# Patient Record
Sex: Female | Born: 1945
Health system: Southern US, Community
[De-identification: ages and names within clinical notes are randomized; demographics above are authoritative.]

## PROBLEM LIST (undated history)

## (undated) DIAGNOSIS — E7800 Pure hypercholesterolemia, unspecified: Secondary | ICD-10-CM

## (undated) DIAGNOSIS — I10 Essential (primary) hypertension: Secondary | ICD-10-CM

---

## 2004-09-20 ENCOUNTER — Ambulatory Visit: Payer: Self-pay | Admitting: Unknown Physician Specialty

## 2005-09-26 ENCOUNTER — Ambulatory Visit: Payer: Self-pay | Admitting: Unknown Physician Specialty

## 2005-12-13 ENCOUNTER — Ambulatory Visit: Payer: Self-pay | Admitting: Orthopaedic Surgery

## 2006-10-10 ENCOUNTER — Ambulatory Visit: Payer: Self-pay | Admitting: Unknown Physician Specialty

## 2007-10-12 ENCOUNTER — Ambulatory Visit: Payer: Self-pay | Admitting: Unknown Physician Specialty

## 2007-10-17 ENCOUNTER — Ambulatory Visit: Payer: Self-pay | Admitting: Unknown Physician Specialty

## 2008-11-06 ENCOUNTER — Ambulatory Visit: Payer: Self-pay | Admitting: Unknown Physician Specialty

## 2009-06-26 ENCOUNTER — Emergency Department: Payer: Self-pay | Admitting: Emergency Medicine

## 2009-11-10 ENCOUNTER — Ambulatory Visit: Payer: Self-pay | Admitting: Unknown Physician Specialty

## 2010-11-08 IMAGING — CR DG CHEST 2V
1 series · 2 of 2 positions shown · non-contrast
Comparison: none

REASON FOR EXAM: CP
COMMENTS:

[Series 1: view not recorded · 0.17mm/px · 2 of 2 slices shown]
[im 1/2]
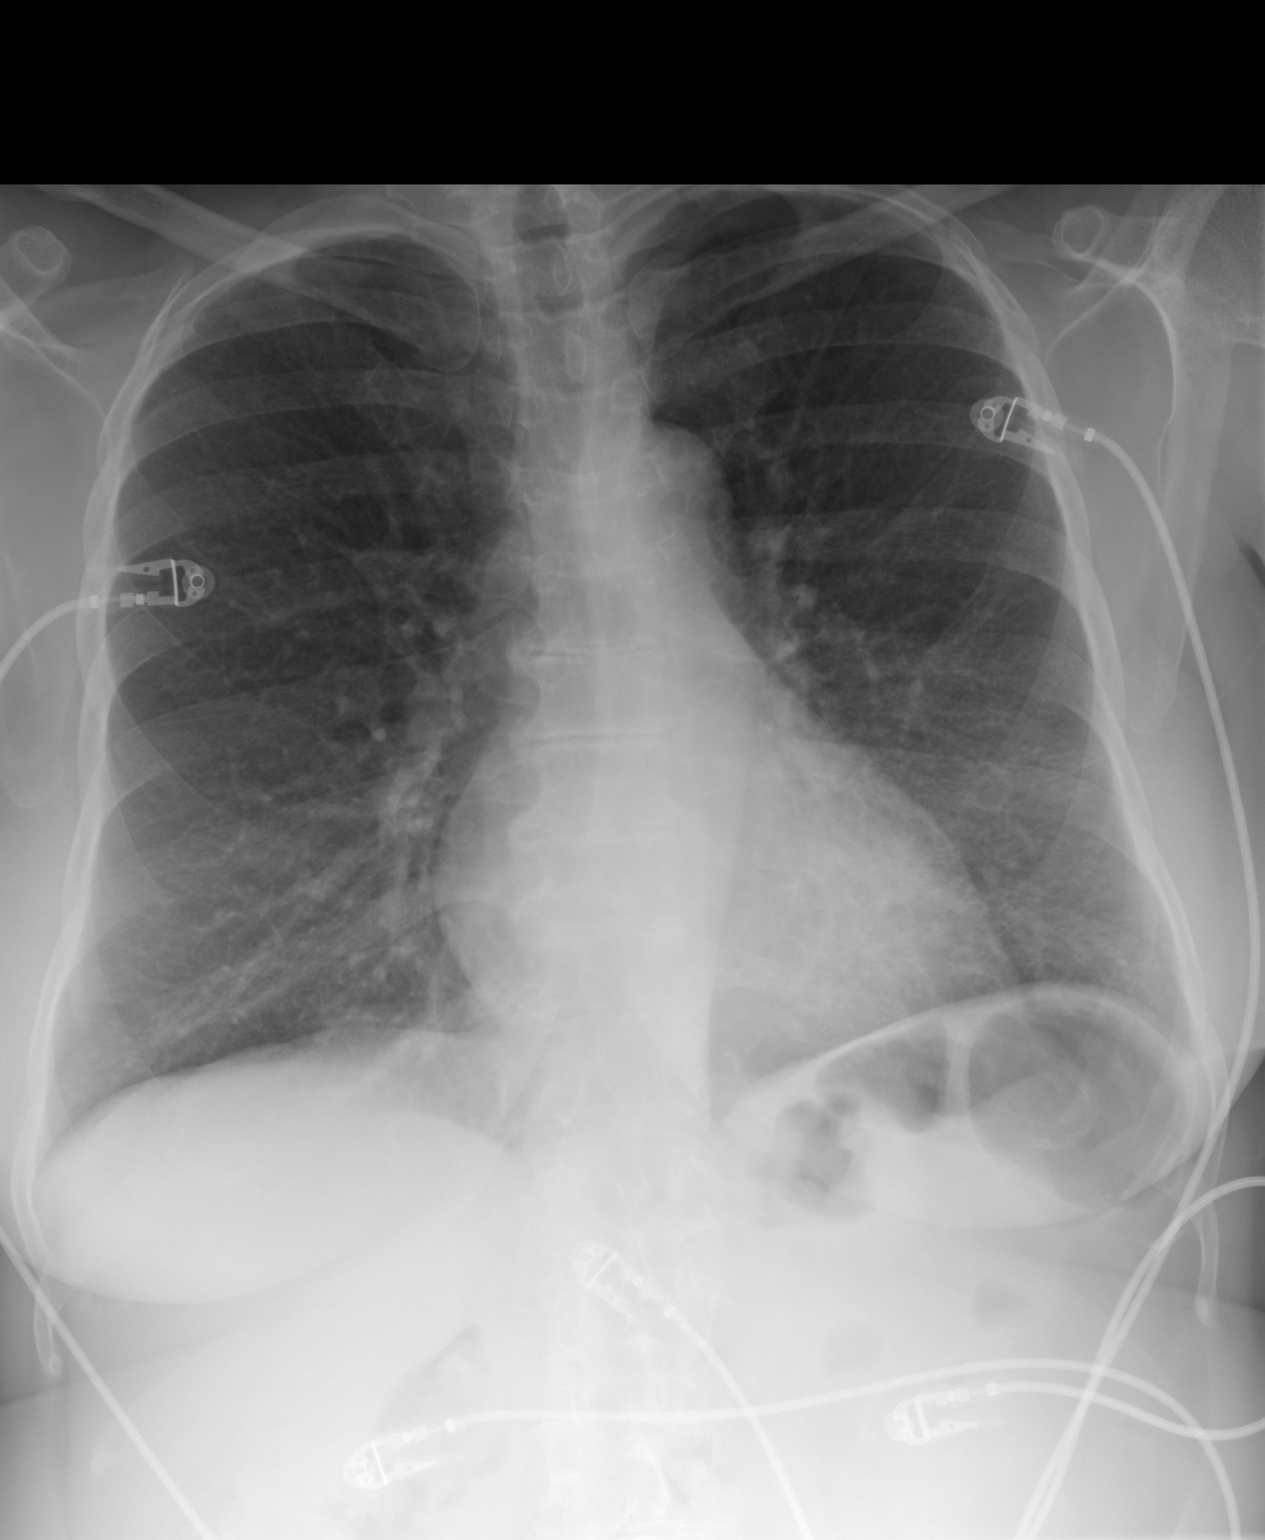
[im 2/2]
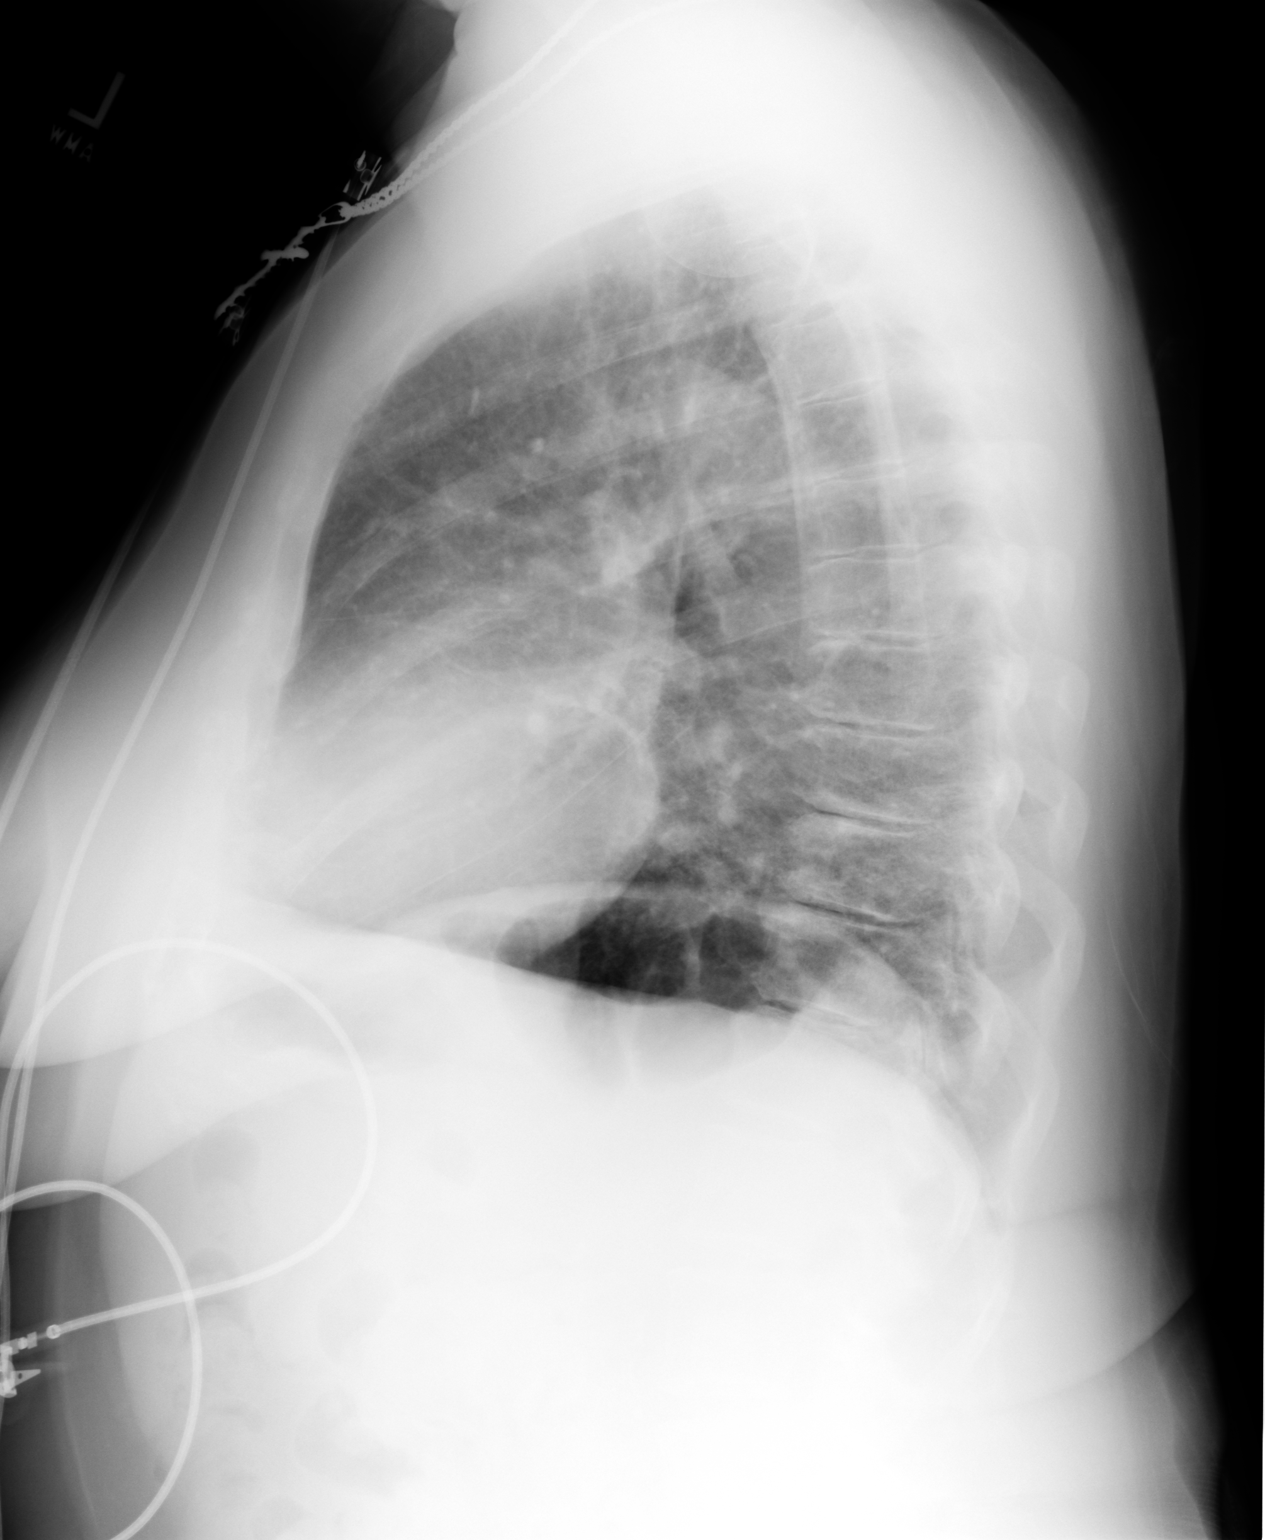

[2 of 2 positions shown; findings below may reference images not displayed]

PROCEDURE:     DXR - DXR CHEST PA (OR AP) AND LATERAL  - June 26, 2009  [DATE]

RESULT:     The lungs are well-expanded. The interstitial markings are
mildly increased diffusely. There is no pleural effusion or alveolar
infiltrate. The cardiac silhouette is normal in size. The there is mild
tortuosity of the descending thoracic aorta. There is degenerative disease
at multiple thoracic disc levels.
IMPRESSION: There is no focal pneumonia nor overt evidence of CHF.
There are mildly increased interstitial markings which may be related to the
history of tobacco use. There are degenerative changes of the thoracic spine.

## 2010-11-17 ENCOUNTER — Ambulatory Visit: Payer: Self-pay | Admitting: Unknown Physician Specialty

## 2011-12-21 ENCOUNTER — Ambulatory Visit: Payer: Self-pay | Admitting: Internal Medicine

## 2012-12-24 ENCOUNTER — Ambulatory Visit: Payer: Self-pay | Admitting: Internal Medicine

## 2013-12-24 ENCOUNTER — Ambulatory Visit: Payer: Self-pay | Admitting: Obstetrics and Gynecology

## 2013-12-24 DIAGNOSIS — Z1231 Encounter for screening mammogram for malignant neoplasm of breast: Secondary | ICD-10-CM | POA: Diagnosis not present

## 2013-12-24 DIAGNOSIS — Z13820 Encounter for screening for osteoporosis: Secondary | ICD-10-CM | POA: Diagnosis not present

## 2013-12-24 DIAGNOSIS — M949 Disorder of cartilage, unspecified: Secondary | ICD-10-CM | POA: Diagnosis not present

## 2013-12-24 DIAGNOSIS — M899 Disorder of bone, unspecified: Secondary | ICD-10-CM | POA: Diagnosis not present

## 2014-07-09 DIAGNOSIS — H2513 Age-related nuclear cataract, bilateral: Secondary | ICD-10-CM | POA: Diagnosis not present

## 2014-10-27 DIAGNOSIS — H43812 Vitreous degeneration, left eye: Secondary | ICD-10-CM | POA: Diagnosis not present

## 2014-10-29 DIAGNOSIS — H43812 Vitreous degeneration, left eye: Secondary | ICD-10-CM | POA: Diagnosis not present

## 2014-11-17 DIAGNOSIS — H43812 Vitreous degeneration, left eye: Secondary | ICD-10-CM | POA: Diagnosis not present

## 2014-12-08 DIAGNOSIS — I10 Essential (primary) hypertension: Secondary | ICD-10-CM | POA: Diagnosis not present

## 2014-12-08 DIAGNOSIS — Z124 Encounter for screening for malignant neoplasm of cervix: Secondary | ICD-10-CM | POA: Diagnosis not present

## 2014-12-08 DIAGNOSIS — Z01419 Encounter for gynecological examination (general) (routine) without abnormal findings: Secondary | ICD-10-CM | POA: Diagnosis not present

## 2014-12-10 DIAGNOSIS — I10 Essential (primary) hypertension: Secondary | ICD-10-CM | POA: Diagnosis not present

## 2014-12-26 ENCOUNTER — Ambulatory Visit
Admit: 2014-12-26 | Disposition: A | Discharge status: Home / Self Care | Payer: Self-pay | Attending: Obstetrics and Gynecology | Admitting: Obstetrics and Gynecology

## 2014-12-26 DIAGNOSIS — Z1231 Encounter for screening mammogram for malignant neoplasm of breast: Secondary | ICD-10-CM | POA: Diagnosis not present

## 2014-12-31 DIAGNOSIS — I10 Essential (primary) hypertension: Secondary | ICD-10-CM | POA: Diagnosis not present

## 2015-01-21 DIAGNOSIS — H43812 Vitreous degeneration, left eye: Secondary | ICD-10-CM | POA: Diagnosis not present

## 2015-02-03 DIAGNOSIS — R809 Proteinuria, unspecified: Secondary | ICD-10-CM | POA: Diagnosis not present

## 2015-05-31 DIAGNOSIS — Z23 Encounter for immunization: Secondary | ICD-10-CM | POA: Diagnosis not present

## 2015-07-28 DIAGNOSIS — H25813 Combined forms of age-related cataract, bilateral: Secondary | ICD-10-CM | POA: Diagnosis not present

## 2015-08-06 DIAGNOSIS — I10 Essential (primary) hypertension: Secondary | ICD-10-CM | POA: Diagnosis not present

## 2015-11-24 DIAGNOSIS — Z13220 Encounter for screening for lipoid disorders: Secondary | ICD-10-CM | POA: Diagnosis not present

## 2015-11-24 DIAGNOSIS — Z131 Encounter for screening for diabetes mellitus: Secondary | ICD-10-CM | POA: Diagnosis not present

## 2015-11-24 DIAGNOSIS — Z136 Encounter for screening for cardiovascular disorders: Secondary | ICD-10-CM | POA: Diagnosis not present

## 2015-11-24 DIAGNOSIS — Z1321 Encounter for screening for nutritional disorder: Secondary | ICD-10-CM | POA: Diagnosis not present

## 2015-11-24 DIAGNOSIS — Z1329 Encounter for screening for other suspected endocrine disorder: Secondary | ICD-10-CM | POA: Diagnosis not present

## 2015-12-09 ENCOUNTER — Other Ambulatory Visit: Payer: Self-pay | Admitting: Obstetrics and Gynecology

## 2015-12-09 DIAGNOSIS — I10 Essential (primary) hypertension: Secondary | ICD-10-CM | POA: Diagnosis not present

## 2015-12-09 DIAGNOSIS — Z1231 Encounter for screening mammogram for malignant neoplasm of breast: Secondary | ICD-10-CM

## 2015-12-28 ENCOUNTER — Other Ambulatory Visit: Payer: Self-pay | Admitting: Obstetrics and Gynecology

## 2015-12-28 ENCOUNTER — Ambulatory Visit
Admission: RE | Admit: 2015-12-28 | Discharge: 2015-12-28 | Disposition: A | Discharge status: Home / Self Care | Payer: Medicare Other | Source: Ambulatory Visit | Attending: Obstetrics and Gynecology | Admitting: Obstetrics and Gynecology

## 2015-12-28 DIAGNOSIS — Z1231 Encounter for screening mammogram for malignant neoplasm of breast: Secondary | ICD-10-CM | POA: Diagnosis not present

## 2016-01-29 DIAGNOSIS — I10 Essential (primary) hypertension: Secondary | ICD-10-CM | POA: Diagnosis not present

## 2016-01-29 DIAGNOSIS — R809 Proteinuria, unspecified: Secondary | ICD-10-CM | POA: Diagnosis not present

## 2016-02-03 DIAGNOSIS — R809 Proteinuria, unspecified: Secondary | ICD-10-CM | POA: Diagnosis not present

## 2016-07-21 DIAGNOSIS — H25813 Combined forms of age-related cataract, bilateral: Secondary | ICD-10-CM | POA: Diagnosis not present

## 2017-01-25 DIAGNOSIS — I10 Essential (primary) hypertension: Secondary | ICD-10-CM | POA: Diagnosis not present

## 2017-01-25 DIAGNOSIS — R809 Proteinuria, unspecified: Secondary | ICD-10-CM | POA: Diagnosis not present

## 2017-02-08 ENCOUNTER — Other Ambulatory Visit: Payer: Self-pay | Admitting: Obstetrics and Gynecology

## 2017-02-08 DIAGNOSIS — Z1231 Encounter for screening mammogram for malignant neoplasm of breast: Secondary | ICD-10-CM

## 2017-02-28 ENCOUNTER — Ambulatory Visit
Admission: RE | Admit: 2017-02-28 | Discharge: 2017-02-28 | Disposition: A | Discharge status: Home / Self Care | Payer: Medicare Other | Source: Ambulatory Visit | Attending: Obstetrics and Gynecology | Admitting: Obstetrics and Gynecology

## 2017-02-28 DIAGNOSIS — Z1231 Encounter for screening mammogram for malignant neoplasm of breast: Secondary | ICD-10-CM | POA: Insufficient documentation

## 2017-07-17 DIAGNOSIS — H25813 Combined forms of age-related cataract, bilateral: Secondary | ICD-10-CM | POA: Diagnosis not present

## 2017-08-07 DIAGNOSIS — H43813 Vitreous degeneration, bilateral: Secondary | ICD-10-CM | POA: Diagnosis not present

## 2017-10-04 DIAGNOSIS — H43811 Vitreous degeneration, right eye: Secondary | ICD-10-CM | POA: Diagnosis not present

## 2017-10-10 DIAGNOSIS — I10 Essential (primary) hypertension: Secondary | ICD-10-CM | POA: Diagnosis not present

## 2017-10-10 DIAGNOSIS — Z136 Encounter for screening for cardiovascular disorders: Secondary | ICD-10-CM | POA: Diagnosis not present

## 2017-10-10 DIAGNOSIS — Z13220 Encounter for screening for lipoid disorders: Secondary | ICD-10-CM | POA: Diagnosis not present

## 2017-10-10 DIAGNOSIS — Z131 Encounter for screening for diabetes mellitus: Secondary | ICD-10-CM | POA: Diagnosis not present

## 2017-10-10 DIAGNOSIS — Z1321 Encounter for screening for nutritional disorder: Secondary | ICD-10-CM | POA: Diagnosis not present

## 2017-10-11 ENCOUNTER — Other Ambulatory Visit: Payer: Self-pay | Admitting: Obstetrics and Gynecology

## 2017-10-11 DIAGNOSIS — Z1231 Encounter for screening mammogram for malignant neoplasm of breast: Secondary | ICD-10-CM

## 2017-11-21 DIAGNOSIS — R944 Abnormal results of kidney function studies: Secondary | ICD-10-CM | POA: Diagnosis not present

## 2017-11-21 DIAGNOSIS — R7309 Other abnormal glucose: Secondary | ICD-10-CM | POA: Diagnosis not present

## 2017-11-21 DIAGNOSIS — R748 Abnormal levels of other serum enzymes: Secondary | ICD-10-CM | POA: Diagnosis not present

## 2018-01-03 DIAGNOSIS — H43811 Vitreous degeneration, right eye: Secondary | ICD-10-CM | POA: Diagnosis not present

## 2018-01-11 DIAGNOSIS — M170 Bilateral primary osteoarthritis of knee: Secondary | ICD-10-CM | POA: Diagnosis not present

## 2018-02-22 DIAGNOSIS — R809 Proteinuria, unspecified: Secondary | ICD-10-CM | POA: Diagnosis not present

## 2018-02-22 DIAGNOSIS — I10 Essential (primary) hypertension: Secondary | ICD-10-CM | POA: Diagnosis not present

## 2018-02-22 DIAGNOSIS — E875 Hyperkalemia: Secondary | ICD-10-CM | POA: Diagnosis not present

## 2018-02-22 DIAGNOSIS — E785 Hyperlipidemia, unspecified: Secondary | ICD-10-CM | POA: Diagnosis not present

## 2018-03-02 ENCOUNTER — Ambulatory Visit
Admission: RE | Admit: 2018-03-02 | Discharge: 2018-03-02 | Disposition: A | Discharge status: Home / Self Care | Payer: Medicare Other | Source: Ambulatory Visit | Attending: Obstetrics and Gynecology | Admitting: Obstetrics and Gynecology

## 2018-03-02 DIAGNOSIS — Z1231 Encounter for screening mammogram for malignant neoplasm of breast: Secondary | ICD-10-CM

## 2018-03-15 DIAGNOSIS — E782 Mixed hyperlipidemia: Secondary | ICD-10-CM | POA: Diagnosis not present

## 2018-03-15 DIAGNOSIS — E559 Vitamin D deficiency, unspecified: Secondary | ICD-10-CM | POA: Diagnosis not present

## 2018-03-15 DIAGNOSIS — I10 Essential (primary) hypertension: Secondary | ICD-10-CM | POA: Diagnosis not present

## 2018-05-04 DIAGNOSIS — H43811 Vitreous degeneration, right eye: Secondary | ICD-10-CM | POA: Diagnosis not present

## 2018-05-04 DIAGNOSIS — H16223 Keratoconjunctivitis sicca, not specified as Sjogren's, bilateral: Secondary | ICD-10-CM | POA: Diagnosis not present

## 2018-07-09 ENCOUNTER — Other Ambulatory Visit: Payer: Self-pay

## 2018-07-09 ENCOUNTER — Emergency Department
Admission: EM | Admit: 2018-07-09 | Discharge: 2018-07-09 | Disposition: A | Discharge status: Home / Self Care | Payer: Medicare Other | Attending: Emergency Medicine | Admitting: Emergency Medicine

## 2018-07-09 DIAGNOSIS — I10 Essential (primary) hypertension: Secondary | ICD-10-CM | POA: Diagnosis not present

## 2018-07-09 DIAGNOSIS — E7800 Pure hypercholesterolemia, unspecified: Secondary | ICD-10-CM | POA: Diagnosis not present

## 2018-07-09 DIAGNOSIS — E860 Dehydration: Secondary | ICD-10-CM | POA: Diagnosis not present

## 2018-07-09 DIAGNOSIS — R42 Dizziness and giddiness: Secondary | ICD-10-CM | POA: Diagnosis present

## 2018-07-09 DIAGNOSIS — R531 Weakness: Secondary | ICD-10-CM | POA: Diagnosis not present

## 2018-07-09 HISTORY — DX: Essential (primary) hypertension: I10

## 2018-07-09 HISTORY — DX: Pure hypercholesterolemia, unspecified: E78.00

## 2018-07-09 LAB — URINALYSIS, COMPLETE (UACMP) WITH MICROSCOPIC
Bacteria, UA: NONE SEEN
Bilirubin Urine: NEGATIVE
GLUCOSE, UA: NEGATIVE mg/dL
Hgb urine dipstick: NEGATIVE
Ketones, ur: 5 mg/dL — AB
Leukocytes, UA: NEGATIVE
Nitrite: NEGATIVE
PH: 6 (ref 5.0–8.0)
Protein, ur: NEGATIVE mg/dL
Specific Gravity, Urine: 1.005 (ref 1.005–1.030)

## 2018-07-09 LAB — BASIC METABOLIC PANEL
Anion gap: 10 (ref 5–15)
BUN: 15 mg/dL (ref 8–23)
CO2: 26 mmol/L (ref 22–32)
Calcium: 9.7 mg/dL (ref 8.9–10.3)
Chloride: 101 mmol/L (ref 98–111)
Creatinine, Ser: 0.87 mg/dL (ref 0.44–1.00)
GFR calc Af Amer: >60 mL/min (ref >60)
Glucose, Bld: 116 mg/dL — ABNORMAL HIGH (ref 70–99)
POTASSIUM: 3.5 mmol/L (ref 3.5–5.1)
SODIUM: 137 mmol/L (ref 135–145)

## 2018-07-09 LAB — CBC
HCT: 39.3 % (ref 36.0–46.0)
Hemoglobin: 13 g/dL (ref 12.0–15.0)
MCH: 30.5 pg (ref 26.0–34.0)
MCHC: 33.1 g/dL (ref 30.0–36.0)
MCV: 92.3 fL (ref 80.0–100.0)
Platelets: 235 10*3/uL (ref 150–400)
RBC: 4.26 MIL/uL (ref 3.87–5.11)
RDW: 15.1 % (ref 11.5–15.5)
WBC: 6.6 10*3/uL (ref 4.0–10.5)
nRBC: 0 % (ref 0.0–0.2)

## 2018-07-09 MED ORDER — SODIUM CHLORIDE 0.9 % IV SOLN
1000.0000 mL | Freq: Once | INTRAVENOUS | Status: AC
Start: 2018-07-09 — End: 2018-07-09
  Administered 2018-07-09: 1000 mL via INTRAVENOUS

## 2018-07-09 NOTE — ED Notes (Signed)

## 2018-07-09 NOTE — ED Triage Notes (Signed)
C/o dizziness since Sunday and low blood pressure today. States dizziness started Sunday. Has BP readings low today multiple times while at work.  Pt alert and oriented X4, active, cooperative, pt in NAD. RR even and unlabored, color WNL.

## 2018-07-09 NOTE — ED Provider Notes (Signed)
[facility] Emergency Department Provider Note   ____________________________________________    I have reviewed the triage vital signs and the nursing notes.   HISTORY  Chief Complaint Dizziness and Hypotension     HPI Kimberly Buckley is a 72 y.o. female who presents with complaints of lightheadedness.  Patient reports that she has felt lightheaded intermittently throughout the day.  She reports that she checked her blood pressure multiple times and each time she did so it seemed to make her feel even worse.  She denies chest pain or palpitations.  No abdominal pain nausea or vomiting.  No fevers or chills or shortness of breath.  Currently she reports she feels well but she is quite anxious because "I never get sick "  Past Medical History:  Diagnosis Date  . Hypercholesteremia   . Hypertension     There are no active problems to display for this patient.   History reviewed. No pertinent surgical history.  Prior to Admission medications   Not on File     Allergies Bee venom  Family History  Problem Relation Age of Onset  . Breast cancer Sister     Social History Social History   Tobacco Use  . Smoking status: Never Smoker  Substance Use Topics  . Alcohol use: Not Currently  . Drug use: Not on file    Review of Systems  Constitutional: No fever/chills Eyes: No visual changes.  ENT: No sore throat. Cardiovascular: Denies chest pain. Respiratory: Denies shortness of breath. Gastrointestinal: No abdominal pain.     Genitourinary: Negative for dysuria. Musculoskeletal: Negative for back pain. Skin: Negative for rash. Neurological: Negative for headaches    ____________________________________________   PHYSICAL EXAM:  VITAL SIGNS: ED Triage Vitals  Enc Vitals Group     BP 07/09/18 1734 104/73     Pulse Rate 07/09/18 1734 (!) 112     Resp 07/09/18 1734 18     Temp 07/09/18 1735 98.7 F (37.1 C)     Temp Source  07/09/18 1735 Oral     SpO2 07/09/18 1734 99 %     Weight 07/09/18 1735 68 kg (150 lb)     Height --      Head Circumference --      Peak Flow --      Pain Score 07/09/18 1735 0     Pain Loc --      Pain Edu? --      Excl. in GC? --     Constitutional: Alert and oriented.   Nose: No congestion/rhinnorhea. Mouth/Throat: Mucous membranes are dry Neck:  Painless ROM Cardiovascular: Normal rate, regular rhythm. Grossly normal heart sounds.  Good peripheral circulation. Respiratory: Normal respiratory effort.  No retractions. Lungs CTAB. Gastrointestinal: Soft and nontender. No distention.  No CVA tenderness.  Musculoskeletal: No lower extremity tenderness nor edema.  Warm and well perfused Neurologic:  Normal speech and language. No gross focal neurologic deficits are appreciated.  Skin:  Skin is warm, dry and intact. No rash noted. Psychiatric: Mood and affect are normal. Speech and behavior are normal.  ____________________________________________   LABS (all labs ordered are listed, but only abnormal results are displayed)  Labs Reviewed  BASIC METABOLIC PANEL - Abnormal; Notable for the following components:      Result Value   Glucose, Bld 116 (*)    All other components within normal limits  URINALYSIS, COMPLETE (UACMP) WITH MICROSCOPIC - Abnormal; Notable for the following components:   Color,  Urine STRAW (*)    APPearance CLEAR (*)    Ketones, ur 5 (*)    All other components within normal limits  CBC   ____________________________________________  EKG  ED ECG REPORT I, Jene Every, the attending physician, personally viewed and interpreted this ECG.  Date: 07/09/2018  Rhythm: normal sinus rhythm QRS Axis: normal Intervals: normal ST/T Wave abnormalities: normal Narrative Interpretation: no evidence of acute  ischemia  ____________________________________________  RADIOLOGY  None ____________________________________________   PROCEDURES  Procedure(s) performed: No  Procedures   Critical Care performed: No ____________________________________________   INITIAL IMPRESSION / ASSESSMENT AND PLAN / ED COURSE  Pertinent labs & imaging results that were available during my care of the patient were reviewed by me and considered in my medical decision making (see chart for details).  Patient overall with reassuring exam although she does appear dehydrated.  She seems to have gotten herself quite anxious regarding her blood pressure although most of her readings have been normal or slightly elevated.  We will treat with IV fluids while we await labs  After fluids patient reports she feels 100% better and wants to go home ASAP.  Her lab work is overall unremarkable, CBC normal, BMP reassuring, no indication for further work-up at this time, return precautions discussed    ____________________________________________   FINAL CLINICAL IMPRESSION(S) / ED DIAGNOSES  Final diagnoses:  Dehydration  Weakness        Note:  This document was prepared using Dragon voice recognition software and may include unintentional dictation errors.    Jene Every, MD 07/09/18 2220

## 2018-08-05 DEATH — deceased
